# Patient Record
Sex: Male | Born: 1965 | Race: White | Hispanic: No | Marital: Married | Smoking: Never smoker
Health system: Southern US, Community
[De-identification: ages and names within clinical notes are randomized; demographics above are authoritative.]

## PROBLEM LIST (undated history)

## (undated) DIAGNOSIS — G4730 Sleep apnea, unspecified: Secondary | ICD-10-CM

## (undated) DIAGNOSIS — K219 Gastro-esophageal reflux disease without esophagitis: Secondary | ICD-10-CM

## (undated) DIAGNOSIS — N529 Male erectile dysfunction, unspecified: Secondary | ICD-10-CM

## (undated) DIAGNOSIS — E8881 Metabolic syndrome: Secondary | ICD-10-CM

## (undated) DIAGNOSIS — G4710 Hypersomnia, unspecified: Secondary | ICD-10-CM

## (undated) DIAGNOSIS — I10 Essential (primary) hypertension: Secondary | ICD-10-CM

## (undated) DIAGNOSIS — G4726 Circadian rhythm sleep disorder, shift work type: Principal | ICD-10-CM

## (undated) DIAGNOSIS — R7301 Impaired fasting glucose: Secondary | ICD-10-CM

## (undated) DIAGNOSIS — R0683 Snoring: Secondary | ICD-10-CM

## (undated) DIAGNOSIS — E785 Hyperlipidemia, unspecified: Secondary | ICD-10-CM

## (undated) DIAGNOSIS — G4733 Obstructive sleep apnea (adult) (pediatric): Secondary | ICD-10-CM

## (undated) DIAGNOSIS — J309 Allergic rhinitis, unspecified: Secondary | ICD-10-CM

## (undated) HISTORY — DX: Gastro-esophageal reflux disease without esophagitis: K21.9

## (undated) HISTORY — DX: Circadian rhythm sleep disorder, shift work type: G47.26

## (undated) HISTORY — DX: Metabolic syndrome: E88.810

## (undated) HISTORY — DX: Male erectile dysfunction, unspecified: N52.9

## (undated) HISTORY — DX: Impaired fasting glucose: R73.01

## (undated) HISTORY — DX: Sleep apnea, unspecified: G47.30

## (undated) HISTORY — DX: Metabolic syndrome: E88.81

## (undated) HISTORY — DX: Allergic rhinitis, unspecified: J30.9

## (undated) HISTORY — DX: Hypersomnia, unspecified: G47.10

## (undated) HISTORY — DX: Snoring: R06.83

## (undated) HISTORY — DX: Obstructive sleep apnea (adult) (pediatric): G47.33

---

## 2012-10-29 HISTORY — PX: OTHER SURGICAL HISTORY: SHX169

## 2012-11-24 ENCOUNTER — Encounter (HOSPITAL_COMMUNITY): Payer: Self-pay | Admitting: *Deleted

## 2012-11-24 ENCOUNTER — Emergency Department (HOSPITAL_COMMUNITY)
Admission: EM | Admit: 2012-11-24 | Discharge: 2012-11-24 | Disposition: A | Discharge status: Home / Self Care | Payer: BC Managed Care – PPO | Attending: Emergency Medicine | Admitting: Emergency Medicine

## 2012-11-24 ENCOUNTER — Emergency Department (HOSPITAL_COMMUNITY): Payer: BC Managed Care – PPO

## 2012-11-24 DIAGNOSIS — F10929 Alcohol use, unspecified with intoxication, unspecified: Secondary | ICD-10-CM

## 2012-11-24 DIAGNOSIS — E785 Hyperlipidemia, unspecified: Secondary | ICD-10-CM | POA: Insufficient documentation

## 2012-11-24 DIAGNOSIS — R569 Unspecified convulsions: Secondary | ICD-10-CM

## 2012-11-24 DIAGNOSIS — I10 Essential (primary) hypertension: Secondary | ICD-10-CM | POA: Insufficient documentation

## 2012-11-24 DIAGNOSIS — F1010 Alcohol abuse, uncomplicated: Secondary | ICD-10-CM | POA: Insufficient documentation

## 2012-11-24 DIAGNOSIS — Z79899 Other long term (current) drug therapy: Secondary | ICD-10-CM | POA: Insufficient documentation

## 2012-11-24 HISTORY — DX: Essential (primary) hypertension: I10

## 2012-11-24 HISTORY — DX: Hyperlipidemia, unspecified: E78.5

## 2012-11-24 LAB — COMPREHENSIVE METABOLIC PANEL
ALT: 54 U/L — ABNORMAL HIGH (ref 0–53)
AST: 33 U/L (ref 0–37)
Alkaline Phosphatase: 64 U/L (ref 39–117)
CO2: 22 mEq/L (ref 19–32)
GFR calc Af Amer: >90 mL/min (ref >90)
GFR calc non Af Amer: >90 mL/min (ref >90)
Glucose, Bld: 123 mg/dL — ABNORMAL HIGH (ref 70–99)
Potassium: 3.9 mEq/L (ref 3.5–5.1)
Sodium: 142 mEq/L (ref 135–145)
Total Protein: 8.1 g/dL (ref 6.0–8.3)

## 2012-11-24 LAB — URINALYSIS, ROUTINE W REFLEX MICROSCOPIC
Bilirubin Urine: NEGATIVE
Glucose, UA: NEGATIVE mg/dL
Hgb urine dipstick: NEGATIVE
Specific Gravity, Urine: 1.006 (ref 1.005–1.030)
Urobilinogen, UA: 0.2 mg/dL (ref 0.0–1.0)

## 2012-11-24 LAB — CBC WITH DIFFERENTIAL/PLATELET
Basophils Absolute: 0 10*3/uL (ref 0.0–0.1)
Lymphocytes Relative: 39 % (ref 12–46)
Lymphs Abs: 2.9 10*3/uL (ref 0.7–4.0)
Neutrophils Relative %: 48 % (ref 43–77)
Platelets: 183 10*3/uL (ref 150–400)
RBC: 4.69 MIL/uL (ref 4.22–5.81)
RDW: 12.2 % (ref 11.5–15.5)
WBC: 7.5 10*3/uL (ref 4.0–10.5)

## 2012-11-24 LAB — ETHANOL: Alcohol, Ethyl (B): 305 mg/dL — ABNORMAL HIGH (ref 0–11)

## 2012-11-24 LAB — RAPID URINE DRUG SCREEN, HOSP PERFORMED
Amphetamines: NOT DETECTED
Benzodiazepines: NOT DETECTED
Cocaine: NOT DETECTED
Opiates: NOT DETECTED
Tetrahydrocannabinol: NOT DETECTED

## 2012-11-24 LAB — SALICYLATE LEVEL: Salicylate Lvl: <2 mg/dL — ABNORMAL LOW (ref 2.8–20.0)

## 2012-11-24 MED ORDER — SODIUM CHLORIDE 0.9 % IV BOLUS (SEPSIS)
1000.0000 mL | Freq: Once | INTRAVENOUS | Status: AC
  Administered 2012-11-24: 1000 mL via INTRAVENOUS

## 2012-11-24 NOTE — ED Notes (Signed)
Per report from [facility] pt was at home and had what is described by family as "convulsions and resp. Changes".  Family states that they heard a thump from the kitchen and reportedly "blacked out".  Approx around 1430 family found pt in the bedroom with eyes opened but having full body convulsions.  Pt has ETOH noted on breath.  He is cooperative and nonhostile at the present.  Pt is Alert to person and place.  No distress noted at the present.  Resp symmetrical and unlabored.  Skin warm and dry.

## 2012-11-24 NOTE — ED Notes (Signed)
Pt had no problems ambulating in hall

## 2012-11-24 NOTE — ED Provider Notes (Addendum)
History  This chart was scribed for George Octave, MD by Bennett Scrape, ED Scribe. This patient was seen in room A04C/A04C and the patient's care was started at 4:48 PM.  CSN: [id number]  Arrival date & time 11/24/12  1641   First MD Initiated Contact with Patient 11/24/12 1648      Chief Complaint  Patient presents with  . Seizures    The history is provided by the patient, the spouse and a relative (daughter). No language interpreter was used.    George Durham is a 47 y.o. male brought in by ambulance, who presents to the Emergency Department complaining of one episode of witnessed seizures described by daughter as altered respiratory pattern, generalized full body convulsions and unresponsiveness lasting 3 to 4 minutes PTA. Per family, pt has a h/o significant alcohol abuse and had been drinking today when he fell twice. She denies witnessing the falls stating that she heard the thump and went into the kitchen to find him on the floor. She denies that the pt consumed enough alcohol to cause a fall. Daughter states that the pt reported that he "blacked out". She states that she got the pt into where he had the seizure 30 to 40 minutes after the second fall. Daughter reports that the pt was confused for 30 minutes after the seizure until EMS arrived. They deny a h/o seizures. Wife states that pt is currently confused. Wife also states that the pt works third shift and becomes sleep deprived often with associated delirium. She states that he has not been sleeping well the past week, averaging 2 to 3 hours a night. Pt is currently alert and oriented x3.  He states that he drinks 6 to 8 "[employer]" a day and reports consuming 3 to 4 today prior to the falls and seizure. He denies seizure withdrawals from alcohol and denies having other alcohol withdrawal symptoms before. He denies tongue biting or urinary or bowel incontinence.  He is currently oriented x3 and c/o lower back pain and  bilateral foot pain but denies recent cough, congestion, rhinorrhea. He denies any illegal drug use and smoking. He takes HTN and HLD medications daily.  Dr. Martha Clan is PCP, last visit was 3 to 4 months ago. Has a physical scheduled in April 2014.  No past medical history on file.  No past surgical history on file.  No family history on file.  History  Substance Use Topics  . Smoking status: Not on file  . Smokeless tobacco: Not on file  . Alcohol Use: Not on file      Review of Systems  A complete 10 system review of systems was obtained and all systems are negative except as noted in the HPI and PMH.   Allergies  Review of patient's allergies indicates not on file.  Home Medications  No current outpatient prescriptions on file.  Triage Vitals: BP 122/77  Pulse 92  Temp 97.6 F (36.4 C)  Resp 20  SpO2 96%  Physical Exam  Nursing note and vitals reviewed. Constitutional: He is oriented to person, place, and time. He appears well-developed and well-nourished. No distress.  HENT:  Head: Normocephalic and atraumatic.  Mouth/Throat: Oropharynx is clear and moist.  Eyes: Conjunctivae normal and EOM are normal. Pupils are equal, round, and reactive to light.  Neck: Normal range of motion. Neck supple. No tracheal deviation present.       No meningismus   Cardiovascular: Normal rate, regular rhythm and intact distal pulses.  Pulmonary/Chest: Effort normal and breath sounds normal. No respiratory distress.  Abdominal: Soft. Bowel sounds are normal. There is no tenderness.  Musculoskeletal: Normal range of motion. He exhibits no edema.  Neurological: He is alert and oriented to person, place, and time. He has normal strength. No cranial nerve deficit ( no gross defecits noted) or sensory deficit. He displays no seizure activity.       No ataxia on finger to nose, 5/5 strength throughout, slurred speech, horizontal nystagmus   Skin: Skin is warm and dry.  Psychiatric:  He has a normal mood and affect.    ED Course  Procedures (including critical care time)  DIAGNOSTIC STUDIES: Oxygen Saturation is 96% on room air, adequate by my interpretation.    COORDINATION OF CARE: 5:00 PM-Discussed treatment plan which includes CT of head, CXR, CBC panel, and UA with pt at bedside and pt agreed to plan.   5:15 PM-Ordered 1,000 mL of bolus  6:40 PM-Pt rechecked and still complains of pain and feeling tired. Informed pt and wife of results. Wife states that pt takes xanax once or twice a week, not daily as listed. Discussed walking and PO challenge and pt agreed. Will provide pt with a referral to neurology.   Labs Reviewed  CBC WITH DIFFERENTIAL - Abnormal; Notable for the following:    MCH 34.8 (*)     MCHC 36.1 (*)     All other components within normal limits  COMPREHENSIVE METABOLIC PANEL - Abnormal; Notable for the following:    Glucose, Bld 123 (*)     ALT 54 (*)     All other components within normal limits  ETHANOL - Abnormal; Notable for the following:    Alcohol, Ethyl (B) 305 (*)     All other components within normal limits  SALICYLATE LEVEL - Abnormal; Notable for the following:    Salicylate Lvl <2.0 (*)     All other components within normal limits  LIPASE, BLOOD  URINALYSIS, ROUTINE W REFLEX MICROSCOPIC  URINE RAPID DRUG SCREEN (HOSP PERFORMED)  ACETAMINOPHEN LEVEL  TROPONIN I   Dg Chest 2 View  11/24/2012  *RADIOLOGY REPORT*  Clinical Data: Altered mental status, syncope, seizure  CHEST - 2 VIEW  Comparison: None.  Findings: Low lung volumes.  No focal consolidation. No pleural effusion or pneumothorax.  Cardiomediastinal silhouette is within normal limits.  Mild degenerative changes of the visualized thoracolumbar spine.  IMPRESSION: No evidence of acute cardiopulmonary disease.   Original Report Authenticated By: Charline Bills, M.D.    Ct Head Wo Contrast  11/24/2012  *RADIOLOGY REPORT*  Clinical Data: Witnessed seizure, fall  CT  HEAD WITHOUT CONTRAST  Technique:  Contiguous axial images were obtained from the base of the skull through the vertex without contrast.  Comparison: None.  Findings: No evidence of parenchymal hemorrhage or extra-axial fluid collection. No mass lesion, mass effect, or midline shift.  No CT evidence of acute infarction.  Cerebral volume is age appropriate.  No ventriculomegaly.  Partial opacification of the right maxillary sinus.  The visualized paranasal sinuses and mastoid air cells are otherwise clear.  No evidence of calvarial fracture.  IMPRESSION: No evidence of acute intracranial abnormality.   Original Report Authenticated By: Charline Bills, M.D.      No diagnosis found.    MDM  Patient from home after questionable seizure activity. The family heard the thump after patient fell in the kitchen and a short time later patient developed full body convulsions with eyes opened and  no responsiveness for several minutes. No tongue biting or urinary incontinence. No history of seizures. Patient has history of alcohol abuse.  CT head negative. Alcohol level greater than 300. Electrolytes unremarkable. No history of seizures. Patient denies taking Xanax regularly. Wife states he may take it one or twice weekly. Drug screen negative.   Patient is at his baseline per family. No seizure activity in ED. He is alert and oriented though intoxicated. He is tolerating by mouth and ambulatory in the ED. Stable for outpatient followup with neurology. Instructed not to drive or operate heavy machinery for 6 months or until followup with neurology.   Date: 11/24/2012  Rate: 94  Rhythm: normal sinus rhythm  QRS Axis: normal  Intervals: normal  ST/T Wave abnormalities: normal  Conduction Disutrbances:none  Narrative Interpretation:   Old EKG Reviewed: none available   I personally performed the services described in this documentation, which was scribed in my presence. The recorded information has  been reviewed and is accurate.        George Octave, MD 11/24/12 2030  George Octave, MD 11/25/12 [phone]

## 2012-11-24 NOTE — ED Notes (Signed)
Pt returned from X-ray.  

## 2013-06-12 IMAGING — CR DG CHEST 2V
1 series · 1 of 1 positions shown · non-contrast
Comparison: None.

CLINICAL DATA: Altered mental status, syncope, seizure

CHEST - 2 VIEW

[w chest lat *]
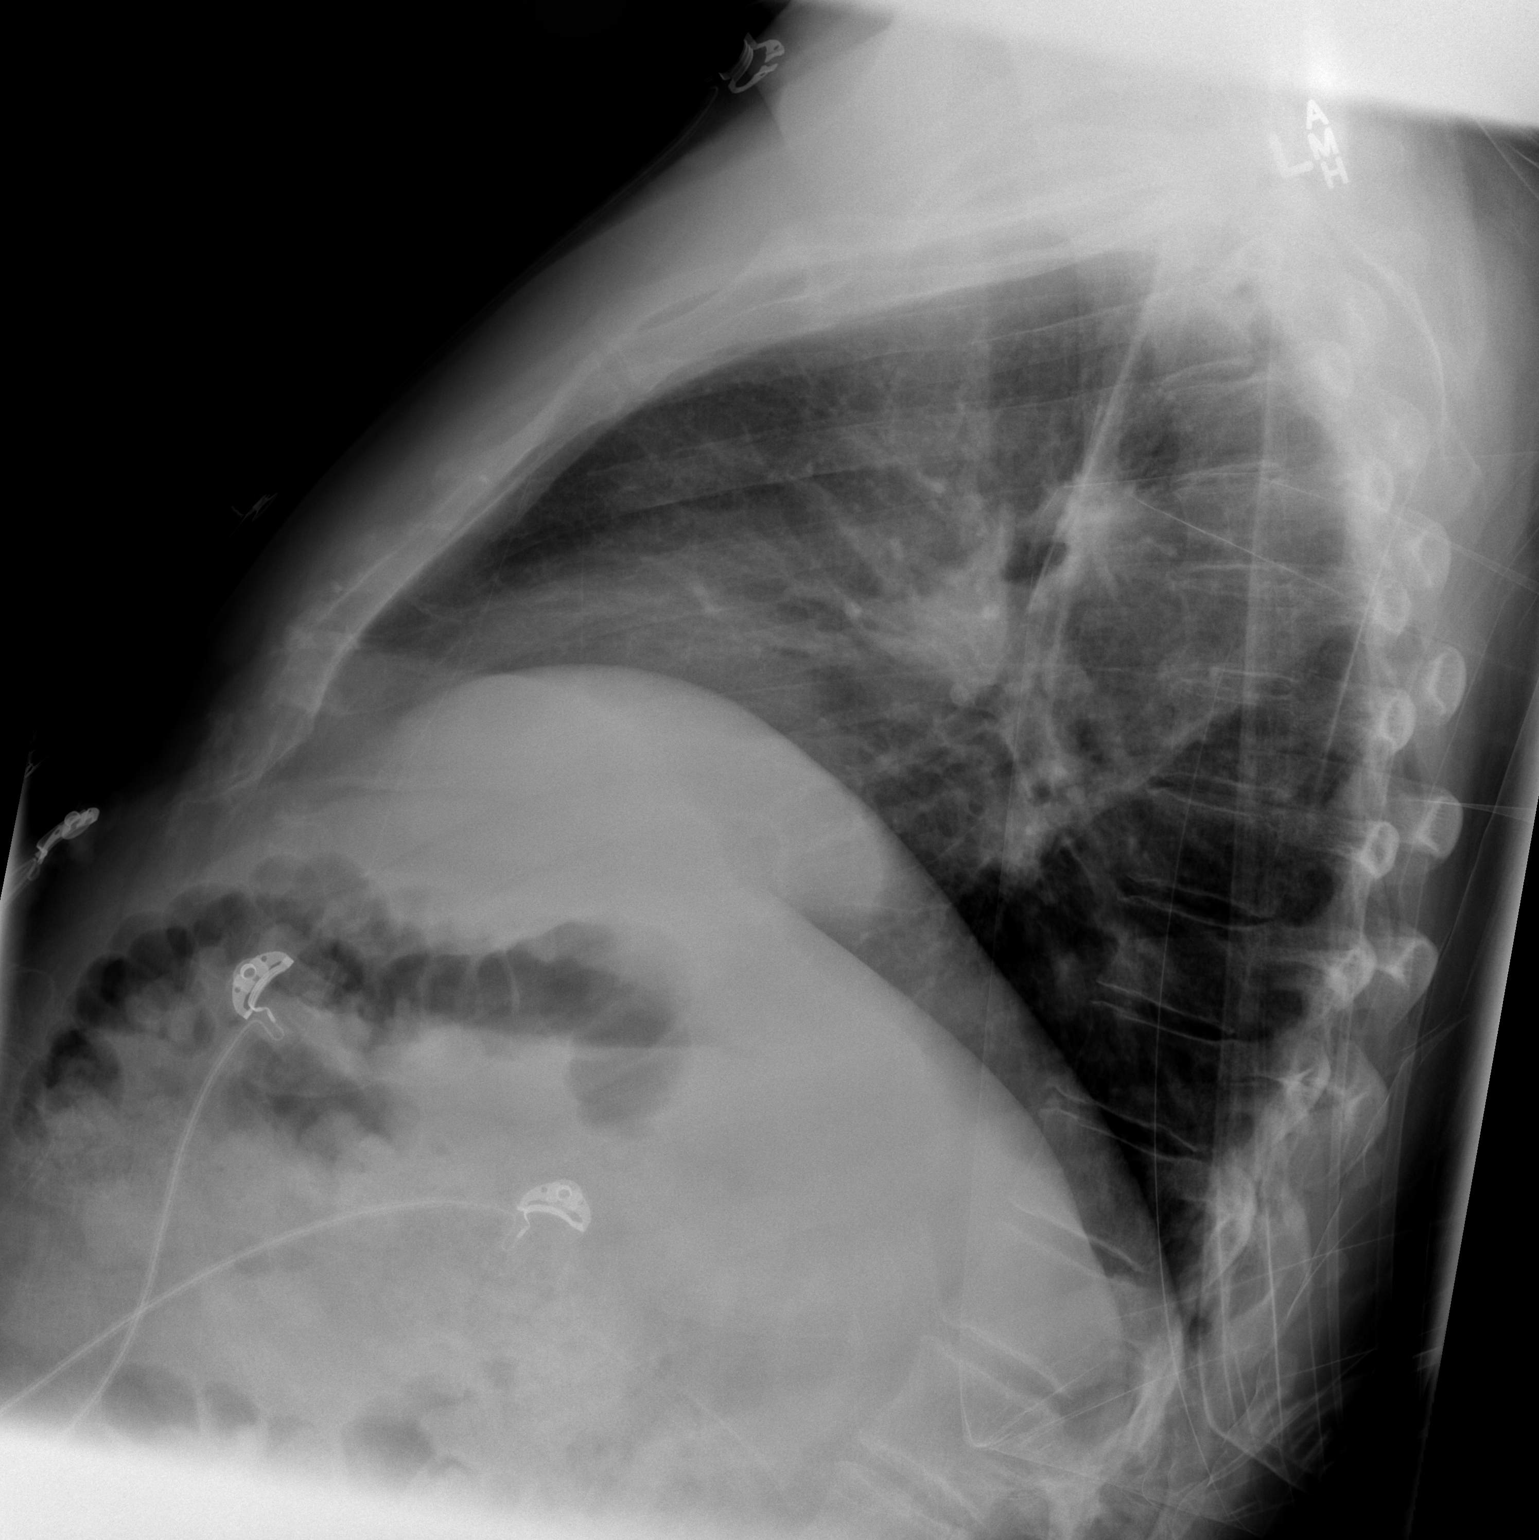

[1 of 1 positions shown; findings below may reference images not displayed]

FINDINGS: Low lung volumes.  No focal consolidation. No pleural
effusion or pneumothorax.

Cardiomediastinal silhouette is within normal limits.

Mild degenerative changes of the visualized thoracolumbar spine.
IMPRESSION: No evidence of acute cardiopulmonary disease.

## 2013-06-12 IMAGING — CT CT HEAD W/O CM
1 series · 16 of 30 positions shown, 20 images · non-contrast
Comparison: None.

CLINICAL DATA: Witnessed seizure, fall

CT HEAD WITHOUT CONTRAST
TECHNIQUE: Contiguous axial images were obtained from the base of
the skull through the vertex without contrast.

[Series 2: head trauma 4.8 h37s · axial · 0.48mm/px · z∈[-86,+72]mm · 16 of 36 slices shown, 20 images]
[im 2/36  brain]
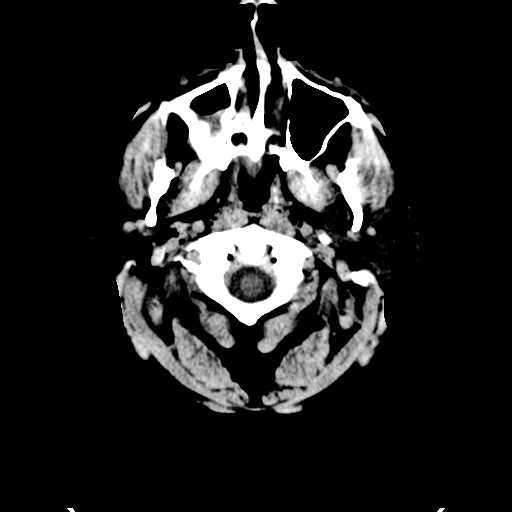
[im 2/36  bone]
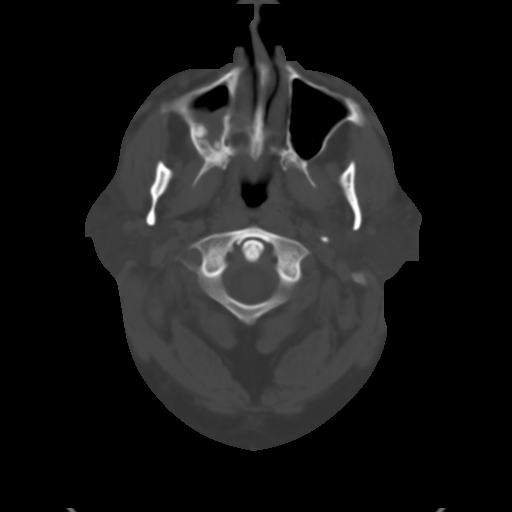
[im 4/36  brain]
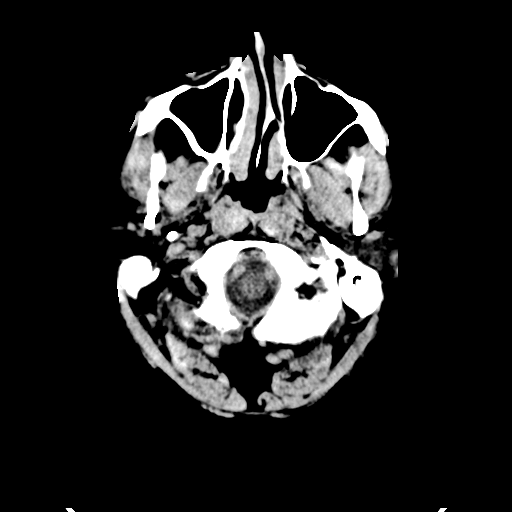
[im 7/36  brain]
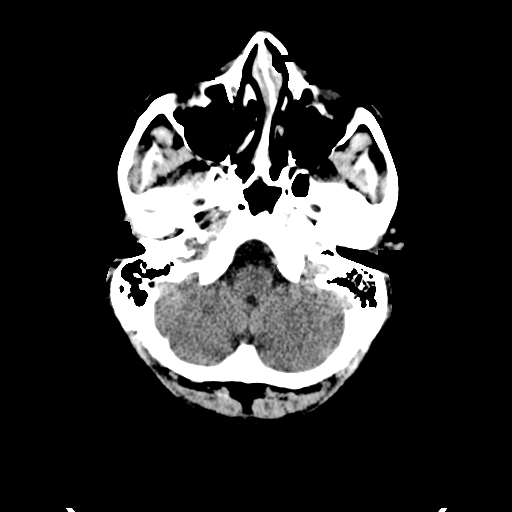
[im 9/36  brain]
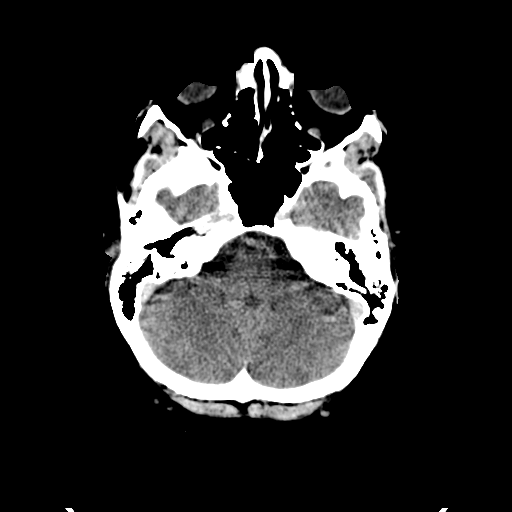
[im 10/36  brain]
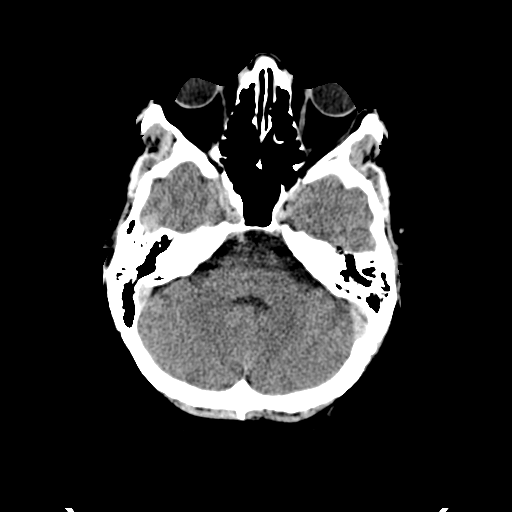
[im 10/36  bone]
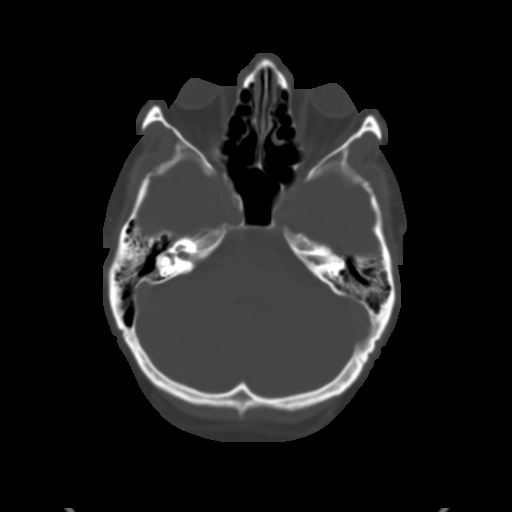
[im 13/36  brain]
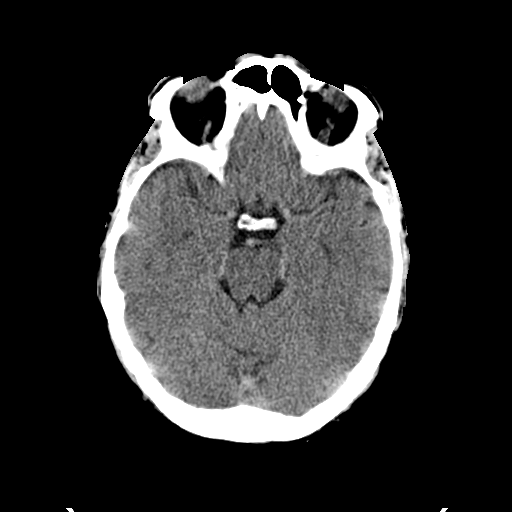
[im 15/36  brain]
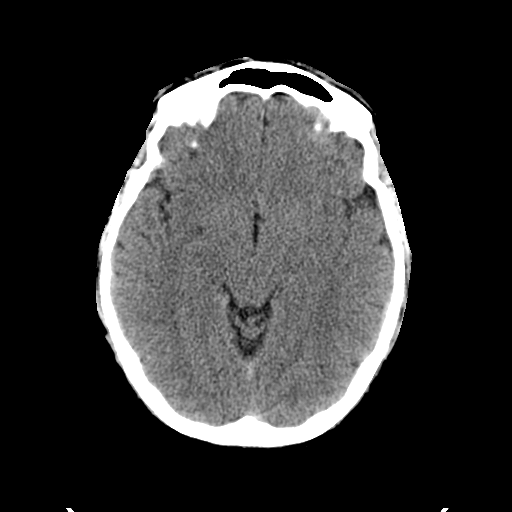
[im 17/36  brain]
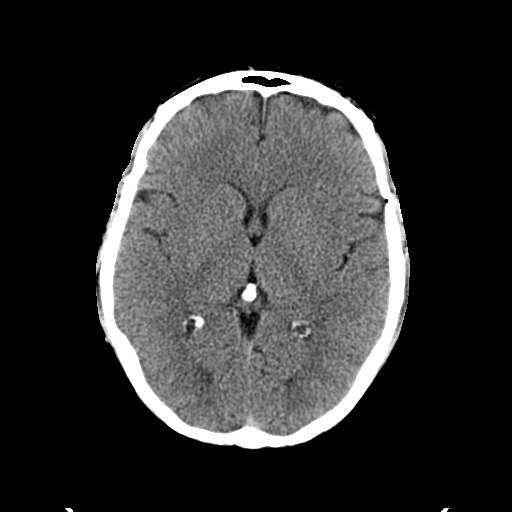
[im 19/36  brain]
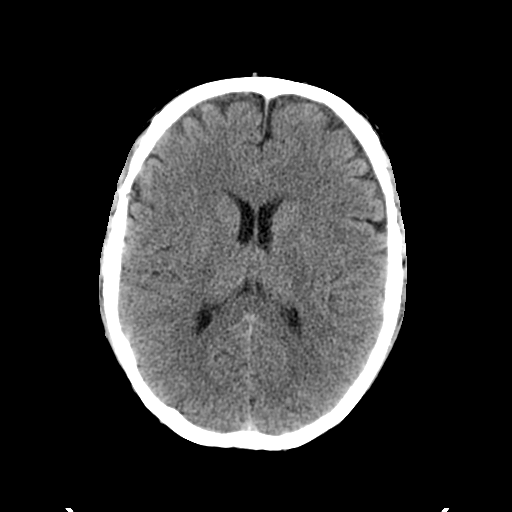
[im 19/36  bone]
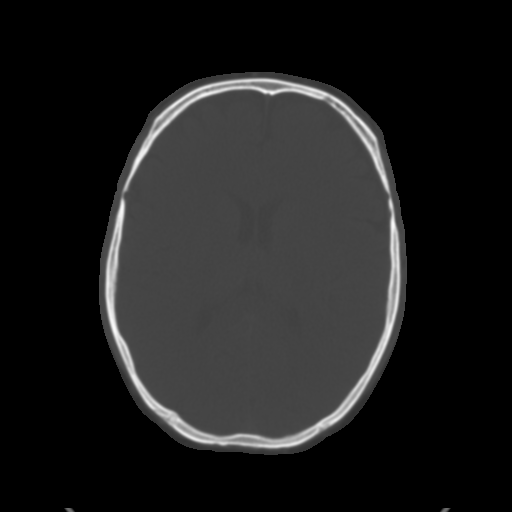
[im 21/36  brain]
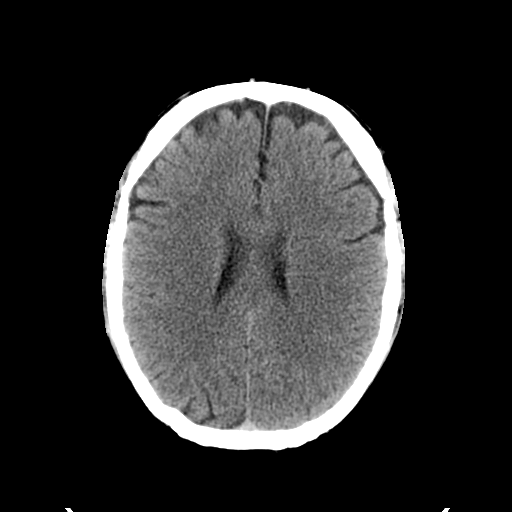
[im 23/36  brain]
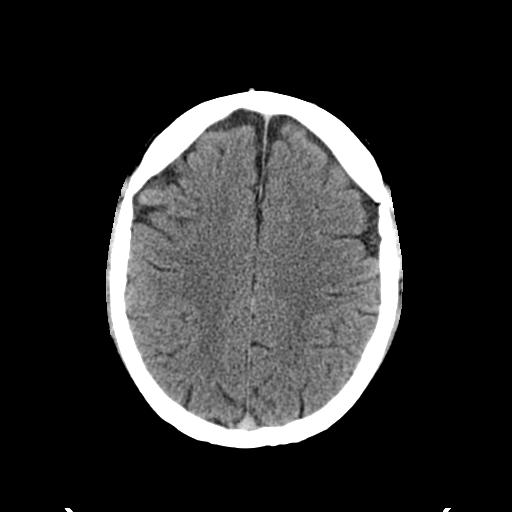
[im 26/36  brain]
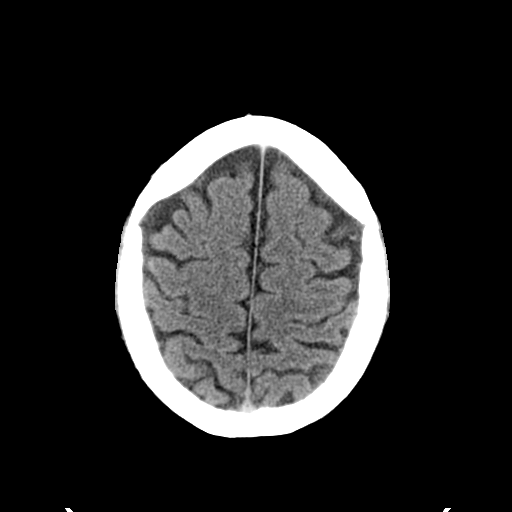
[im 27/36  brain]
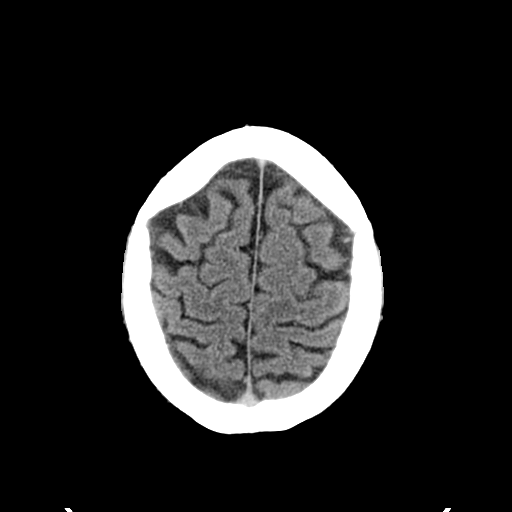
[im 27/36  bone]
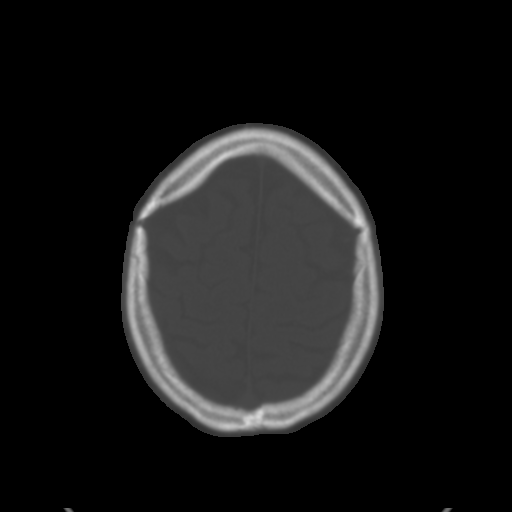
[im 29/36  brain]
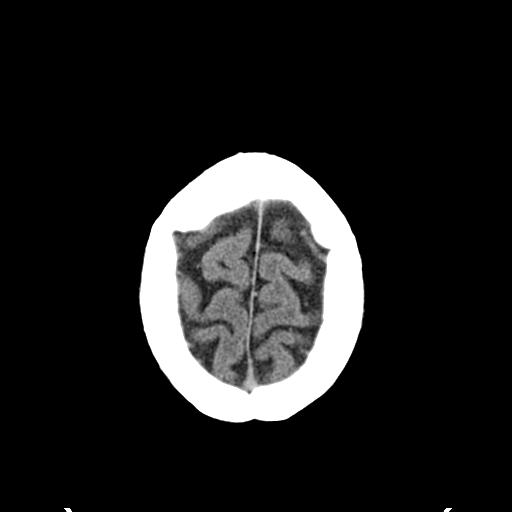
[im 32/36  brain]
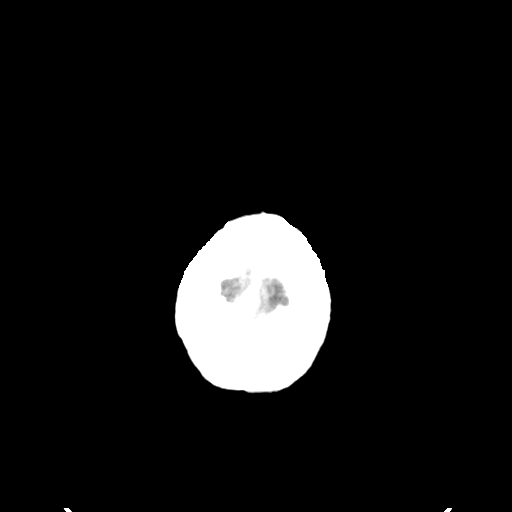
[im 34/36  brain]
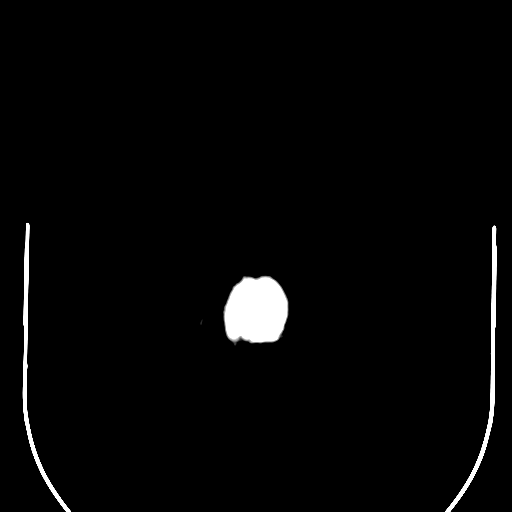

[16 of 30 positions shown; findings below may reference images not displayed]

FINDINGS: No evidence of parenchymal hemorrhage or extra-axial
fluid collection. No mass lesion, mass effect, or midline shift.

No CT evidence of acute infarction.

Cerebral volume is age appropriate.  No ventriculomegaly.

Partial opacification of the right maxillary sinus.  The visualized
paranasal sinuses and mastoid air cells are otherwise clear.

No evidence of calvarial fracture.
IMPRESSION: No evidence of acute intracranial abnormality.

## 2014-03-12 ENCOUNTER — Encounter: Payer: Self-pay | Admitting: *Deleted

## 2014-03-12 ENCOUNTER — Encounter: Payer: Self-pay | Admitting: Neurology

## 2014-03-15 ENCOUNTER — Encounter: Payer: Self-pay | Admitting: Neurology

## 2014-03-15 ENCOUNTER — Encounter (INDEPENDENT_AMBULATORY_CARE_PROVIDER_SITE_OTHER): Payer: Self-pay

## 2014-03-15 ENCOUNTER — Ambulatory Visit (INDEPENDENT_AMBULATORY_CARE_PROVIDER_SITE_OTHER): Payer: BC Managed Care – PPO | Admitting: Neurology

## 2014-03-15 VITALS — BP 125/82 | HR 96 | Resp 18 | Ht 68.25 in | Wt 224.0 lb

## 2014-03-15 DIAGNOSIS — G4726 Circadian rhythm sleep disorder, shift work type: Secondary | ICD-10-CM

## 2014-03-15 DIAGNOSIS — R0609 Other forms of dyspnea: Secondary | ICD-10-CM

## 2014-03-15 DIAGNOSIS — R0683 Snoring: Secondary | ICD-10-CM

## 2014-03-15 DIAGNOSIS — G4710 Hypersomnia, unspecified: Secondary | ICD-10-CM

## 2014-03-15 DIAGNOSIS — G4730 Sleep apnea, unspecified: Secondary | ICD-10-CM

## 2014-03-15 DIAGNOSIS — R0989 Other specified symptoms and signs involving the circulatory and respiratory systems: Secondary | ICD-10-CM

## 2014-03-15 HISTORY — DX: Circadian rhythm sleep disorder, shift work type: G47.26

## 2014-03-15 HISTORY — DX: Hypersomnia, unspecified: G47.10

## 2014-03-15 HISTORY — DX: Snoring: R06.83

## 2014-03-15 NOTE — Patient Instructions (Signed)

## 2014-03-15 NOTE — Progress Notes (Signed)
[facility] SLEEP MEDICINE CONSULT   Provider:  Melvyn Novasarmen  George Durham, M D  Referring Provider: Martha ClanShaw, William, MD Primary Care Physician:  Martha ClanShaw, William, MD  Chief Complaint  Patient presents with  . New Evaluation    Room 10  . Sleep consult    HPI:  George Durham is a 48 y.o. male , caucasian shiftworker , who is seen here as a referral  from Dr. Clelia CroftShaw for a sleep consultation.  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 He remains excessively sleepy and uses Nuvigil.  The patient was evaluated on 05-05-2007 in a split night protocol [facility], Dr. Martha ClanWilliam Durham his PCP , referred at that time.  The study revealed an AHI of 29 in supine sleep of 26.7 and during REM sleep of 15.8. Oxygen nadir was 85% was only 4.8 minutes of desaturation. The average snoring level was considered very loud and caused additional arousals it was also dependent on a sleep position, being worse in supine sleep. The patient was titrated to CPAP at 9 cm water pressure with a residual AHI of 0.4. His periodic limb movements were minimal. He has still no PLMs no kicking and no parasomnia reported.  His sleep habits have changed with shift work, he prefers to lay down around 5 PM and gets up at around 1 in the morning 1:45 AM depending on when his shift starts. He also reports that at lunchtime he has no problem taking a [employer] pollen apples and instructed. Feels somewhat refreshed after that. The patient endorsed the Epworth sleepiness score at a very high score 20/24 points.  This is while  on Provigil !!!. FSS is  low however 21 points. He feels that he has not a lack of energy, but a  lack of sleep time.  No nocturia.  Overall daytime sleeptime is about 6 hours.    On the way to work he will drink one cup of caffeinated coffee but he does not drink caffeine later on. Is not a smoker and is a social alcohol drinker.   He is not aware of any family history of OSA. No  history of sleep walking , parasomnia.    Review of Systems: Out of a complete 14 system review, the patient complains of only the following symptoms, and all other reviewed systems are negative. EDS 20 out of 24 points.  FSS is 21, depression score 2.   History   Social History  . Marital Status: Married    Spouse Name: George DimmerKerry    Number of Children: 1  . Years of Education: HS   Occupational History  .     Social History Main Topics  . Smoking status: Never Smoker   . Smokeless tobacco: Never Used  . Alcohol Use: 8.4 oz/week    14 Cans of beer per week     Comment: 3 drinks per day  . Drug Use: No  . Sexual Activity: Not on file   Other Topics Concern  . Not on file   Social History Narrative   Patient is married George Durham(George Durham).   Patient has a high school education.   Patient has one son and one step-daughter.   Patient works full-time at [employer].   Patient drinks one cup of coffee every day.    Family History  Problem Relation Age of Onset  . Cancer Father     Head and neck  . Diabetes Father   .  Breast cancer Brother   . COPD Father   . CAD Father     Past Medical History  Diagnosis Date  . Hypertension   . Hyperlipemia   . IFG (impaired fasting glucose)   . Metabolic syndrome   . OSA (obstructive sleep apnea)   . GERD (gastroesophageal reflux disease)   . ED (erectile dysfunction)   . Allergic rhinitis   . Shifting sleep-work schedule, affecting sleep 03/15/2014  . Hypersomnia, persistent 03/15/2014  . Snoring 03/15/2014  . Hypersomnia with sleep apnea, unspecified 03/15/2014    Past Surgical History  Procedure Laterality Date  . Arm surgery Left 2014    Current Outpatient Prescriptions  Medication Sig Dispense Refill  . ALPRAZolam (XANAX) 1 MG tablet Take 1 mg by mouth daily.      . Armodafinil (NUVIGIL) 250 MG tablet Take 250 mg by mouth daily.      . fluticasone (FLONASE) 50 MCG/ACT nasal spray Place 1 spray into both nostrils daily.      Marland Kitchen.  losartan (COZAAR) 100 MG tablet Take 100 mg by mouth daily.      . montelukast (SINGULAIR) 10 MG tablet Take 10 mg by mouth at bedtime.      Marland Kitchen. omeprazole (PRILOSEC) 40 MG capsule Take 40 mg by mouth daily.      . simvastatin (ZOCOR) 20 MG tablet Take 20 mg by mouth every evening.       No current facility-administered medications for this visit.    Allergies as of 03/15/2014  . (No Known Allergies)    Vitals: BP 125/82  Pulse 96  Resp 18  Ht 5' 8.25" (1.734 m)  Wt 224 lb (101.606 kg)  BMI 33.79 kg/m2 Last Weight:  Wt Readings from Last 1 Encounters:  03/15/14 224 lb (101.606 kg)   Last Height:   Ht Readings from Last 1 Encounters:  03/15/14 5' 8.25" (1.734 m)    Physical exam:  General: The patient is awake, alert and appears not in acute distress. The patient is well groomed. Head: Normocephalic, atraumatic. Neck is supple. Mallampati 4, neck circumference: 21 inches, no TMJ, mild retrognathia, crowded dental status.  Cardiovascular:  Regular rate and rhythm , without  murmurs or carotid bruit, and without distended neck veins. Respiratory: Lungs are clear to auscultation. Skin:  Without evidence of edema, or rash Trunk: BMI is elevated ,patient  has normal posture.  Neurologic exam : The patient is awake and alert, oriented to place and time.  Memory subjective described as intact. There is a normal attention span & concentration ability. Speech is fluent without dysarthria, dysphonia or aphasia. Mood and affect are appropriate.  Cranial nerves: Pupils are equal and briskly reactive to light. Funduscopic exam without  evidence of pallor or edema. Extraocular movements  in vertical and horizontal planes intact and without nystagmus. Visual fields by finger perimetry are intact. Hearing to finger rub intact.  Facial sensation intact to fine touch.  Facial motor strength is symmetric and tongue and uvula move midline.  Motor exam:  Normal tone and normal muscle bulk and  symmetric normal strength in all extremities.  Sensory:  Fine touch, pinprick and vibration were tested in all extremities.  Proprioception is tested in the upper extremities only. This was normal.  Coordination: Rapid alternating movements in the fingers/hands is tested and normal.  Finger-to-nose maneuver tested and normal without evidence of ataxia, dysmetria or tremor.  Gait and station: Patient walks without assistive device. Strength within normal limits. Stance is stable  and normal.  Tandem gait is  unfragmented. Romberg testing is normal.  Deep tendon reflexes: in the  upper and lower extremities are symmetric . Babinski maneuver response is downgoing.   Assessment:  After physical and neurologic examination, review of laboratory studies, imaging, neurophysiology testing and pre-existing records, assessment is  1) obesity , high neck circumference and weight gain of 30 pounds since his last sleep study-  2) likely OSA is still present , if not  worsened.  3) full facial hair- will use nasal pillow and recommend a Claritin daily during allergy season.  Plan:  Treatment plan and additional workup : SPLIT again 3% and AHI 10 , and prescribe CPAP.

## 2014-04-14 ENCOUNTER — Encounter: Payer: BC Managed Care – PPO | Admitting: *Deleted

## 2014-04-21 ENCOUNTER — Encounter: Payer: BC Managed Care – PPO | Admitting: *Deleted

## 2015-03-07 ENCOUNTER — Encounter: Payer: Self-pay | Admitting: Gastroenterology

## 2015-05-13 ENCOUNTER — Ambulatory Visit (AMBULATORY_SURGERY_CENTER): Payer: Self-pay | Admitting: *Deleted

## 2015-05-13 VITALS — Ht 68.0 in | Wt 221.4 lb

## 2015-05-13 DIAGNOSIS — Z1211 Encounter for screening for malignant neoplasm of colon: Secondary | ICD-10-CM

## 2015-05-13 NOTE — Progress Notes (Signed)
No egg or soy allergy No issues with past sedation No diet pills emmi video declined   

## 2015-05-25 ENCOUNTER — Telehealth: Payer: Self-pay | Admitting: Gastroenterology

## 2015-05-26 NOTE — Telephone Encounter (Signed)
No, but please see if he will reschedule

## 2015-05-27 ENCOUNTER — Encounter: Payer: BLUE CROSS/BLUE SHIELD | Admitting: Gastroenterology
